# Patient Record
Sex: Female | Born: 1970 | Race: White | Hispanic: No | Smoking: Never smoker
Health system: Southern US, Community
[De-identification: ages and names within clinical notes are randomized; demographics above are authoritative.]

## PROBLEM LIST (undated history)

## (undated) DIAGNOSIS — I219 Acute myocardial infarction, unspecified: Secondary | ICD-10-CM

## (undated) DIAGNOSIS — E7800 Pure hypercholesterolemia, unspecified: Secondary | ICD-10-CM

## (undated) DIAGNOSIS — F419 Anxiety disorder, unspecified: Secondary | ICD-10-CM

## (undated) HISTORY — PX: LEEP: SHX91

## (undated) HISTORY — DX: Acute myocardial infarction, unspecified: I21.9

## (undated) HISTORY — DX: Pure hypercholesterolemia, unspecified: E78.00

## (undated) HISTORY — DX: Anxiety disorder, unspecified: F41.9

---

## 2008-07-22 ENCOUNTER — Ambulatory Visit (HOSPITAL_BASED_OUTPATIENT_CLINIC_OR_DEPARTMENT_OTHER): Admission: RE | Admit: 2008-07-22 | Discharge: 2008-07-22 | Payer: Self-pay | Admitting: Family Medicine

## 2008-07-22 ENCOUNTER — Ambulatory Visit: Payer: Self-pay | Admitting: Radiology

## 2008-07-23 ENCOUNTER — Ambulatory Visit: Payer: Self-pay | Admitting: Radiology

## 2008-07-23 ENCOUNTER — Ambulatory Visit (HOSPITAL_BASED_OUTPATIENT_CLINIC_OR_DEPARTMENT_OTHER): Admission: RE | Admit: 2008-07-23 | Discharge: 2008-07-23 | Payer: Self-pay | Admitting: Family Medicine

## 2008-08-07 ENCOUNTER — Ambulatory Visit: Payer: Self-pay | Admitting: Diagnostic Radiology

## 2008-08-07 ENCOUNTER — Ambulatory Visit (HOSPITAL_BASED_OUTPATIENT_CLINIC_OR_DEPARTMENT_OTHER): Admission: RE | Admit: 2008-08-07 | Discharge: 2008-08-07 | Payer: Self-pay | Admitting: Family Medicine

## 2011-01-19 IMAGING — CR DG KNEE COMPLETE 4+V*L*
5 series · 5 of 5 positions shown · non-contrast
Comparison: None available.

CLINICAL DATA: Left knee pain.  Status post fall 8 days ago.

LEFT KNEE - COMPLETE 4+ VIEW

[t knee ap left]
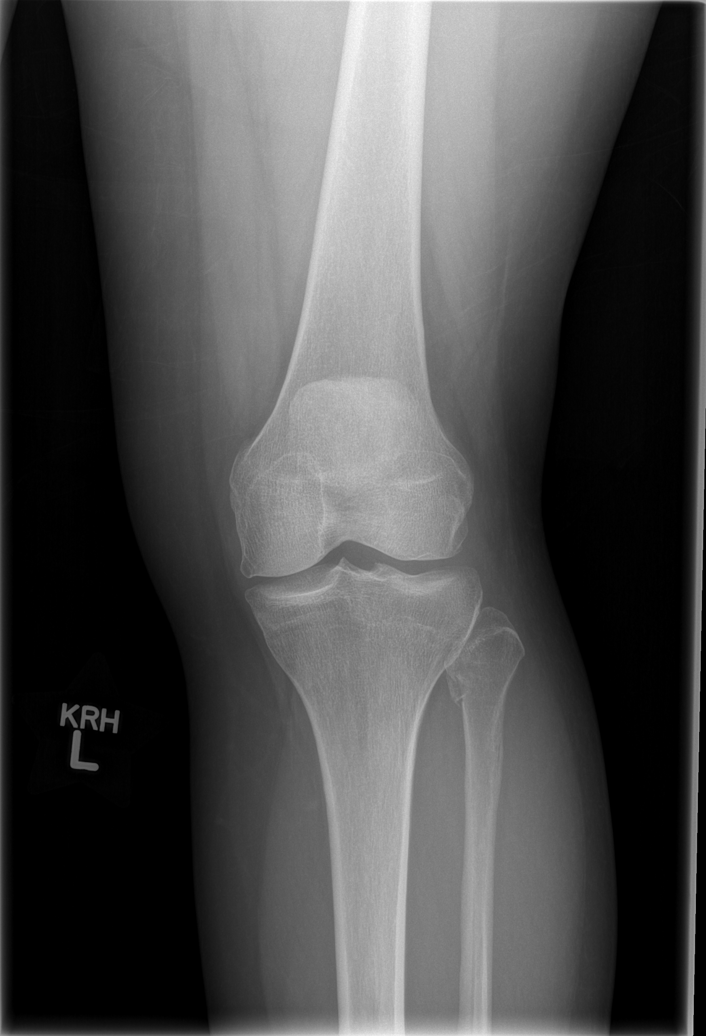

[t knee oblique left (1 of 2)]
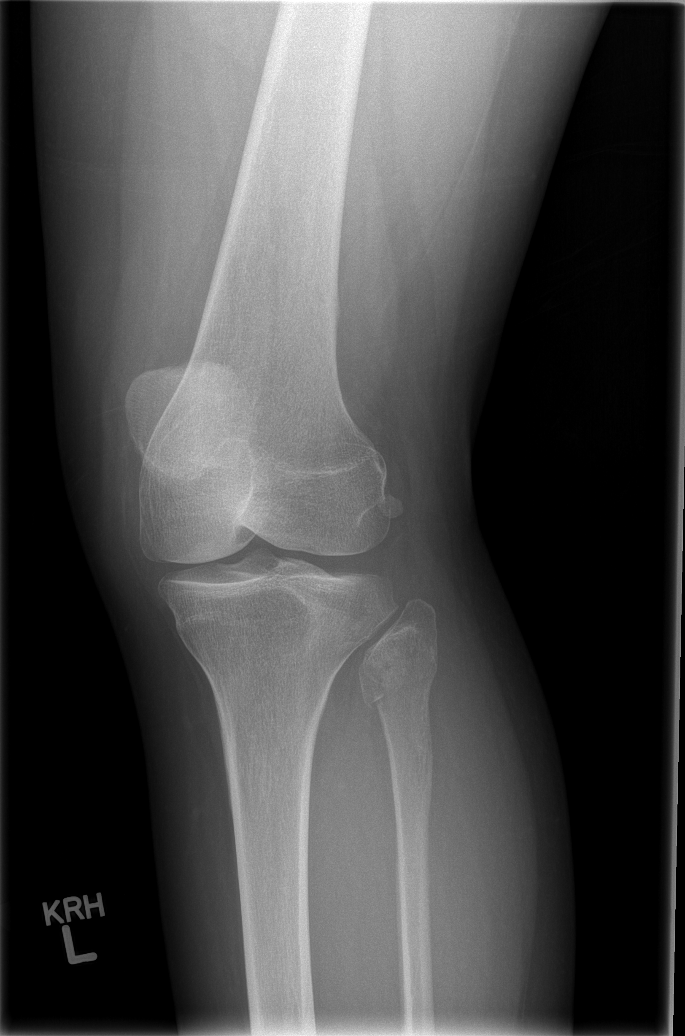

[t knee oblique left (2 of 2)]
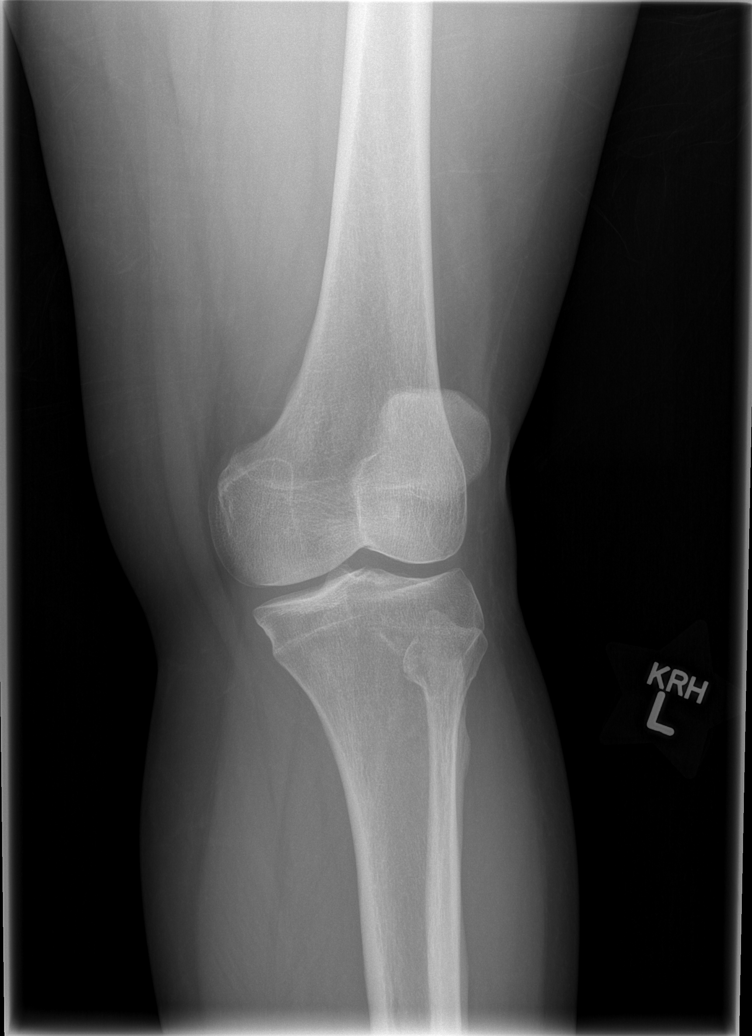

[t knee lat left (1 of 2)]
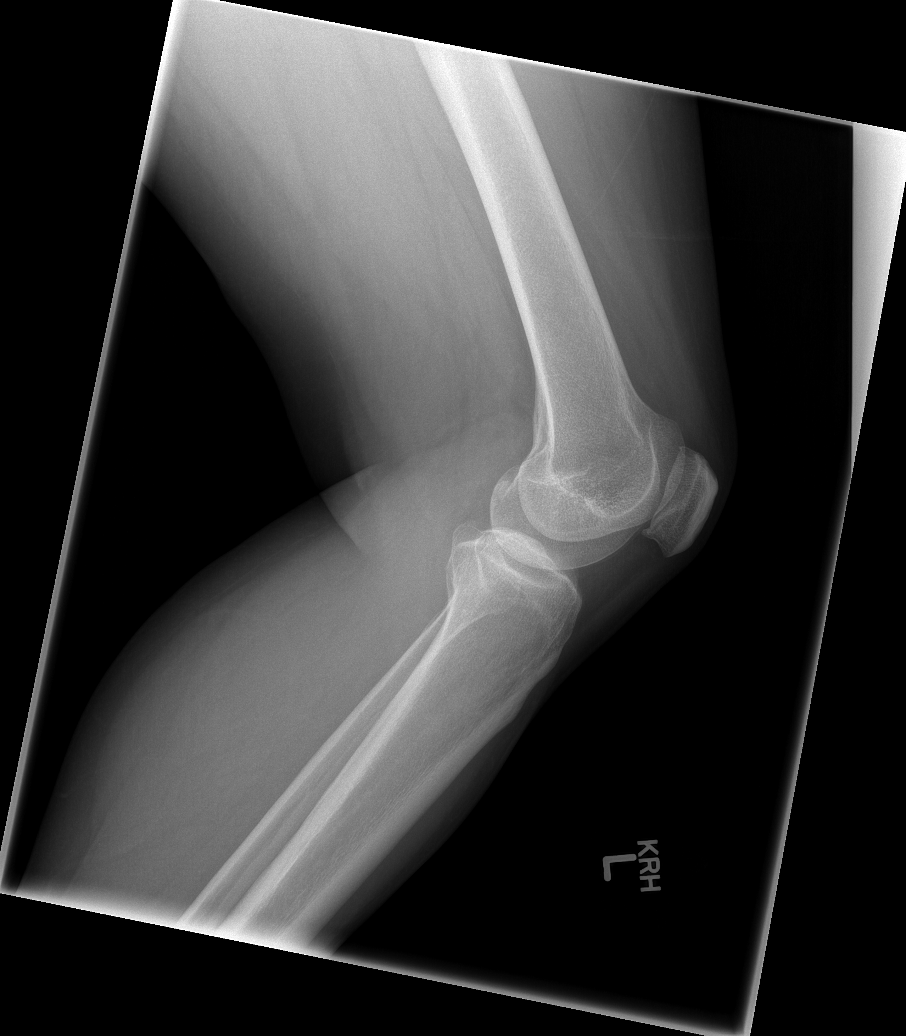

[t knee lat left (2 of 2)]
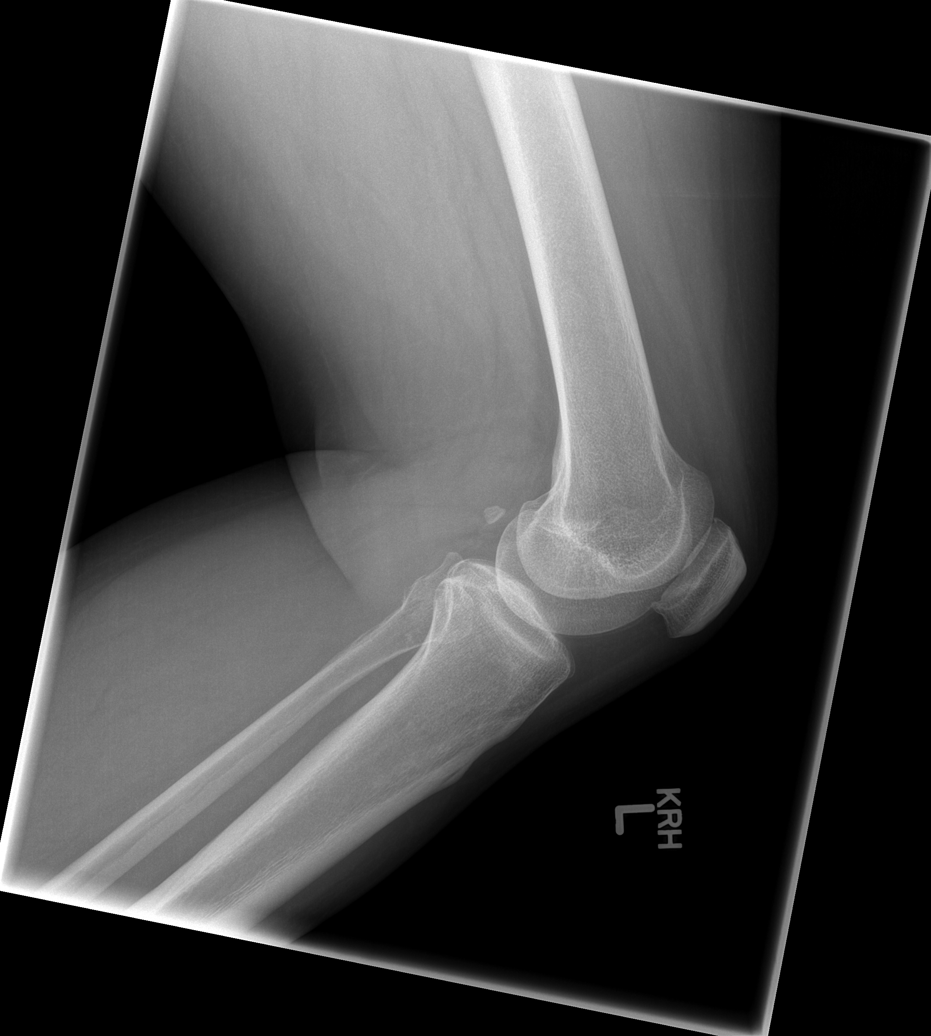

[5 of 5 positions shown; findings below may reference images not displayed]

FINDINGS: A minimally-displaced fracture is present at the left
fibular head.  Mild degenerative changes are noted in the medial
and patellofemoral compartments of the head.  There is no
significant joint effusion.
IMPRESSION: 1.  Minimally-displaced fracture of the fibular head.
2.  Mild degenerative changes of the knee.

## 2019-02-13 ENCOUNTER — Emergency Department (HOSPITAL_COMMUNITY): Payer: BC Managed Care – PPO

## 2019-02-13 ENCOUNTER — Emergency Department (HOSPITAL_COMMUNITY)
Admission: EM | Admit: 2019-02-13 | Discharge: 2019-02-13 | Disposition: A | Discharge status: Home / Self Care | Payer: BC Managed Care – PPO | Attending: Emergency Medicine | Admitting: Emergency Medicine

## 2019-02-13 ENCOUNTER — Other Ambulatory Visit: Payer: Self-pay

## 2019-02-13 DIAGNOSIS — H539 Unspecified visual disturbance: Secondary | ICD-10-CM | POA: Diagnosis not present

## 2019-02-13 LAB — CBC WITH DIFFERENTIAL/PLATELET
Abs Immature Granulocytes: 0.08 10*3/uL — ABNORMAL HIGH (ref 0.00–0.07)
Basophils Absolute: 0 10*3/uL (ref 0.0–0.1)
Basophils Relative: 0 %
Eosinophils Absolute: 0.2 10*3/uL (ref 0.0–0.5)
Eosinophils Relative: 1 %
HCT: 41.7 % (ref 36.0–46.0)
Hemoglobin: 13.7 g/dL (ref 12.0–15.0)
Immature Granulocytes: 1 %
Lymphocytes Relative: 18 %
Lymphs Abs: 2.2 10*3/uL (ref 0.7–4.0)
MCH: 29.8 pg (ref 26.0–34.0)
MCHC: 32.9 g/dL (ref 30.0–36.0)
MCV: 90.7 fL (ref 80.0–100.0)
Monocytes Absolute: 1 10*3/uL (ref 0.1–1.0)
Monocytes Relative: 8 %
Neutro Abs: 8.9 10*3/uL — ABNORMAL HIGH (ref 1.7–7.7)
Neutrophils Relative %: 72 %
Platelets: 268 10*3/uL (ref 150–400)
RBC: 4.6 MIL/uL (ref 3.87–5.11)
RDW: 12.6 % (ref 11.5–15.5)
WBC: 12.3 10*3/uL — ABNORMAL HIGH (ref 4.0–10.5)
nRBC: 0 % (ref 0.0–0.2)

## 2019-02-13 LAB — BASIC METABOLIC PANEL
Anion gap: 11 (ref 5–15)
BUN: 15 mg/dL (ref 6–20)
CO2: 24 mmol/L (ref 22–32)
Calcium: 9.5 mg/dL (ref 8.9–10.3)
Chloride: 104 mmol/L (ref 98–111)
Creatinine, Ser: 0.71 mg/dL (ref 0.44–1.00)
GFR calc Af Amer: >60 mL/min (ref >60)
GFR calc non Af Amer: >60 mL/min (ref >60)
Glucose, Bld: 106 mg/dL — ABNORMAL HIGH (ref 70–99)
Potassium: 3.6 mmol/L (ref 3.5–5.1)
Sodium: 139 mmol/L (ref 135–145)

## 2019-02-13 MED ORDER — GADOBUTROL 1 MMOL/ML IV SOLN
7.5000 mL | Freq: Once | INTRAVENOUS | Status: AC | PRN
  Administered 2019-02-13: 7.5 mL via INTRAVENOUS

## 2019-02-13 NOTE — ED Notes (Signed)
Pt returned to room from MRI.

## 2019-02-13 NOTE — Discharge Instructions (Addendum)
Please follow-up with Dr. Manuella Ghazi as previously indicated.  Please return to the emergency room immediately if he develop any new or worsening signs or symptoms.

## 2019-02-13 NOTE — ED Provider Notes (Signed)
[place] EMERGENCY DEPARTMENT Provider Note   CSN: [id number] Arrival date & time: 02/13/19  5366     History   Chief Complaint No chief complaint on file.   HPI Kathleen Richards is a 48 y.o. female.     HPI  48 year old female presents today for MRI.  Patient notes that she had a minor change in her visual acuity in her left eye that she was having difficulty seeing up close.  She was seen by the optometrist and told she had some inflammation in the eye.  She was referred to ophthalmology where she was evaluated and referred to the ED for MRI.  Patient was seen by Dr. Manuella Ghazi at [facility] with concern for orbital mass posterior to the globe.  She notes occasional headache, denies any significant eye pain or headache presently.  She denies any distal neurological deficits, change in smell or taste.   No past medical history on file.  There are no active problems to display for this patient.     OB History   No obstetric history on file.      Home Medications    Prior to Admission medications   Not on File    Family History No family history on file.  Social History Social History   Tobacco Use  . Smoking status: Not on file  Substance Use Topics  . Alcohol use: Not on file  . Drug use: Not on file     Allergies   Patient has no allergy information on record.   Review of Systems Review of Systems  All other systems reviewed and are negative.   Physical Exam Updated Vital Signs BP (!) 142/73 (BP Location: Left Arm)   Pulse 83   Temp 97.9 F (36.6 C) (Oral)   Resp 17   SpO2 100%   Physical Exam Vitals signs and nursing note reviewed.  Constitutional:      Appearance: She is well-developed.  HENT:     Head: Normocephalic and atraumatic.     Comments: Pupils equal round and reactive light and symmetric, extraocular movements intact and pain-free, vision intact bilateral Eyes:     General: No scleral icterus.       Right  eye: No discharge.        Left eye: No discharge.     Conjunctiva/sclera: Conjunctivae normal.     Pupils: Pupils are equal, round, and reactive to light.  Neck:     Musculoskeletal: Normal range of motion.     Vascular: No JVD.     Trachea: No tracheal deviation.  Pulmonary:     Effort: Pulmonary effort is normal.     Breath sounds: No stridor.  Neurological:     General: No focal deficit present.     Mental Status: She is alert and oriented to person, place, and time. Mental status is at baseline.     Cranial Nerves: No cranial nerve deficit.     Sensory: No sensory deficit.     Motor: No weakness.     Coordination: Coordination normal.  Psychiatric:        Behavior: Behavior normal.        Thought Content: Thought content normal.        Judgment: Judgment normal.      ED Treatments / Results  Labs (all labs ordered are listed, but only abnormal results are displayed) Labs Reviewed  CBC WITH DIFFERENTIAL/PLATELET - Abnormal; Notable for the following components:  Result Value   WBC 12.3 (*)    Neutro Abs 8.9 (*)    Abs Immature Granulocytes 0.08 (*)    All other components within normal limits  BASIC METABOLIC PANEL - Abnormal; Notable for the following components:   Glucose, Bld 106 (*)    All other components within normal limits    EKG None  Radiology No results found.  Procedures Procedures (including critical care time)  Medications Ordered in ED Medications  gadobutrol (GADAVIST) 1 MMOL/ML injection 7.5 mL (7.5 mLs Intravenous Contrast Given 02/13/19 1403)     Initial Impression / Assessment and Plan / ED Course  I have reviewed the triage vital signs and the nursing notes.  Pertinent labs & imaging results that were available during my care of the patient were reviewed by me and considered in my medical decision making (see chart for details).        Labs:   Imaging:  Consults: Dr. Sherryll BurgerShah  Therapeutics:  Discharge Meds:     Assessment/Plan: [age] YOF presents today for MRI via ophthalmology.  Patient has abnormality on her exam per ophthalmology requires MRI with and without contrast of the brain and orbits.  MRI returned showing no acute abnormalities.  I discussed the case with ophthalmology and neuroradiologist.  Patient is stable for outpatient follow-up with ophthalmology and will be following up this week.  She is given strict return precautions, she verbalized understanding and agreement to this plan had no further questions or concerns.   Final Clinical Impressions(s) / ED Diagnoses   Final diagnoses:  Vision changes    ED Discharge Orders    None       Rosalio LoudHedges, Narayan Scull, PA-C 02/17/19 0931    Blane OharaZavitz, Joshua, MD 02/20/19 [phone]

## 2019-02-13 NOTE — ED Notes (Signed)
Patient verbalizes understanding of discharge instructions. Opportunity for questioning and answers were provided. Armband removed by staff. Patient discharged from ED.  

## 2019-02-13 NOTE — ED Notes (Signed)
Patient transported to MRI 

## 2019-02-13 NOTE — ED Triage Notes (Addendum)
Patient sent by ophthalmologist for MRI of L eye - patient endorses vision changes of L eye, headache, and 55mm protrusion (per doctor) since June. NAD noted.

## 2019-08-28 NOTE — Progress Notes (Signed)
Office Visit Note  Patient: Kathleen Richards             Date of Birth: December 26, 1970           MRN: [id number]             PCP: Mariel Sleet Referring: Elisabeth Cara, * Visit Date: 09/04/2019 Occupation: '@GUAROCC' @  Subjective:  Scleritis.   History of Present Illness: Kathleen Richards is a 49 y.o. female seen in consultation per request of her PCP.  According to patient her symptoms started about 3 years ago with left ankle joint swelling.  She states she started experiencing severe pain in her left ankle joint which was last about 3 to 4 days and these episodes will happen about every 3 to 4 months.  She was seen by Dr. Nehemiah Massed in [facility] who did work-up and diagnosed her with pseudogout as all of the work-up was negative.  She was placed on colchicine which she took on a as needed basis for about 1 week.  She states she has not had any episodes in the last 2 years.  In March 2020 she was hospitalized due to MI which was related to supraventricular tachycardia per patient.  She was a started on multiple medications and she has been symptom-free.  She has been followed by cardiologist.  She states in May 2020 she started experiencing left eye discomfort for which she was seen by her optometrist and prescription change was noted.  She was referred to Dr. Manuella Ghazi for local ophthalmologist who did MRI of her brain which was normal and referred her to Dr. Dwana Melena at [facility].  He diagnosed her with posterior scleritis.  No treatment was offered.  She was referred to a neurologist because her mother has history of multiple sclerosis.  Her neurological work-up including MRI of her brain was normal.  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  No treatment was offered.  She was also referred to a hematologist.  Besides elevated WBCs count no other diagnosis was established.  She was referred to  me for further evaluation of any underlying autoimmune disease.  She feels some stiffness in her legs in the morning.  But had no joint pain or joint swelling.  Activities of Daily Living:  Patient reports morning stiffness for 5-10 minutes.   Patient Denies nocturnal pain.  Difficulty dressing/grooming: Denies Difficulty climbing stairs: Denies Difficulty getting out of chair: Denies Difficulty using hands for taps, buttons, cutlery, and/or writing: Reports  Review of Systems  Constitutional: Positive for fatigue. Negative for night sweats, weight gain and weight loss.  HENT: Positive for sore tongue. Negative for mouth sores, trouble swallowing, trouble swallowing, mouth dryness and nose dryness.   Eyes: Negative for pain, redness, itching, visual disturbance and dryness.  Respiratory: Negative for cough, shortness of breath and difficulty breathing.   Cardiovascular: Negative for chest pain, palpitations, hypertension, irregular heartbeat and swelling in legs/feet.  Gastrointestinal: Negative for blood in stool, constipation and diarrhea.  Endocrine: Negative for increased urination.  Genitourinary: Negative for difficulty urinating, painful urination and vaginal dryness.  Musculoskeletal: Positive for arthralgias, joint pain and morning stiffness. Negative for joint swelling, myalgias, muscle weakness, muscle tenderness and myalgias.  Skin: Positive for rash. Negative for color change, hair loss, skin tightness, ulcers and sensitivity to sunlight.       Diagnosed  with rosacea by dermatologist in the past.  Allergic/Immunologic: Negative for susceptible to infections.  Neurological: Positive for headaches. Negative for dizziness, numbness, memory loss, night sweats and weakness.  Hematological: Negative for bruising/bleeding tendency and swollen glands.  Psychiatric/Behavioral: Positive for sleep disturbance. Negative for depressed mood and confusion. The patient is nervous/anxious.      PMFS History:  Patient Active Problem List   Diagnosis Date Noted  . 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 History of hyperlipidemia 09/04/2019  . GAD (generalized anxiety disorder) 09/04/2019  . Seasonal allergies 09/04/2019  . SVT (supraventricular tachycardia) ([place]) 09/04/2019  . History of MI (myocardial infarction) 09/04/2019    Past Medical History:  Diagnosis Date  . Anxiety   . Heart attack ([place])   . High cholesterol     Family History  Problem Relation Age of Onset  . Pancreatic cancer Mother   . Multiple sclerosis Mother   . Skin cancer Father   . COPD Father   . Arrhythmia Sister   . Anxiety disorder Sister    Past Surgical History:  Procedure Laterality Date  . LEEP     x2 (age 31 and 7)   Social History   Social History Narrative  . Not on file    There is no immunization history on file for this patient.   Objective: Vital Signs: BP (!) 143/84 (BP Location: Right Arm, Patient Position: Sitting, Cuff Size: Normal)   Pulse 77   Resp 14   Ht '5\' 3"'  (1.6 m)   Wt 220 lb (99.8 kg)   BMI 38.97 kg/m    Physical Exam Vitals and nursing note reviewed.  Constitutional:      Appearance: She is well-developed.  HENT:     Head: Normocephalic and atraumatic.  Eyes:     Conjunctiva/sclera: Conjunctivae normal.  Cardiovascular:     Rate and Rhythm: Normal rate and regular rhythm.     Heart sounds: Normal heart sounds.  Pulmonary:     Effort: Pulmonary effort is normal.     Breath sounds: Normal breath sounds.  Abdominal:     General: Bowel sounds are normal.     Palpations: Abdomen is soft.  Musculoskeletal:     Cervical back: Normal range of motion.  Lymphadenopathy:     Cervical: No cervical adenopathy.  Skin:    General: Skin is warm and dry.     Capillary Refill: Capillary refill takes less than 2 seconds.  Neurological:     Mental Status: She is alert and oriented to person, place, and  time.  Psychiatric:        Behavior: Behavior normal.      Musculoskeletal Exam: C-spine, thoracic and lumbar spine were in good range of motion.  She had no SI joint tenderness.  Shoulder joints, elbow joints, wrist joints, MCPs PIPs been good range of motion.  She has mild DIP thickening.  Hip joints, knee joints, ankles with good range of motion.  She has bilateral pes planus and ankle joint subluxation.  No synovitis was noted. CDAI Exam: CDAI Score: -- Patient Global: --; Provider Global: -- Swollen: --; Tender: -- Joint Exam 09/04/2019   No joint exam has been documented for this visit   There is currently no information documented on the homunculus. Go to the Rheumatology activity and complete the homunculus joint exam.  Investigation: No additional findings.  Imaging: No results found.  Recent Labs: Lab Results  Component  Value Date   WBC 12.3 (H) 02/13/2019   HGB 13.7 02/13/2019   PLT 268 02/13/2019   NA 139 02/13/2019   K 3.6 02/13/2019   CL 104 02/13/2019   CO2 24 02/13/2019   GLUCOSE 106 (H) 02/13/2019   BUN 15 02/13/2019   CREATININE 0.71 02/13/2019   CALCIUM 9.5 02/13/2019   GFRAA >60 02/13/2019  Labs from [facility] May 15, 2019 TB Gold negative, pan ANCA negative, RF negative, ACE normal, ESR 31 February 21, 2019 CBC showed WBC count 12.3  Speciality Comments: No specialty comments available.  Procedures:  No procedures performed Allergies: Patient has no known allergies.   Assessment / Plan:     Visit Diagnoses: Posterior scleritis of left eye - Dr. Leodis Liverpool and Dr. Manuella Ghazi.  Brain MRI 02/13/19 WNL.  Fluorescein angiogram planned.  Patient had extensive work-up by ophthalmologist in [place], [facility], [place].  She is also seen neurologist and hematologist.  I will complete autoimmune work-up which has not been done so far.  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  No treatment was advised.  Pes planus of both feet-she has severe  bilateral pes planus and ankle subluxation.  No synovitis was noted.  Positive ANA-I will obtain additional labs today.  History of hyperlipidemia  History of MI (myocardial infarction) - March 2020 secondary to SVT.  SVT (supraventricular tachycardia) ([facility])  GAD (generalized anxiety disorder)  Seasonal allergies  Family history of MS (multiple sclerosis) - Mother  Pseudogout-patient states about 3 years ago she was having recurrent swelling of her left ankle joint which was diagnosed as pseudogout by Dr. Nehemiah Massed.  She was placed on colchicine.  Although she has not had any episodes in 2 years and did not require the medication.  Orders: Orders Placed This Encounter  Procedures  . CBC with Differential/Platelet  . COMPLETE METABOLIC PANEL WITH GFR  . HLA-B27 antigen  . ANA  . Cyclic citrul peptide antibody, IgG  . 14-3-3 eta Protein  . Anti-scleroderma antibody  . RNP Antibody  . Anti-Smith antibody  . Sjogrens syndrome-A extractable nuclear antibody  . Sjogrens syndrome-B extractable nuclear antibody  . Anti-DNA antibody, double-stranded  . C3 and C4   No orders of the defined types were placed in this encounter.   Face-to-face time spent with patient was 60 minutes. Greater than 50% of time was spent in counseling and coordination of care.  Follow-Up Instructions: No follow-ups on file.   Bo Merino, MD  Note - This record has been created using Editor, commissioning.  Chart creation errors have been sought, but may not always  have been located. Such creation errors do not reflect on  the standard of medical care.

## 2019-09-04 ENCOUNTER — Ambulatory Visit: Payer: BC Managed Care – PPO | Admitting: Rheumatology

## 2019-09-04 ENCOUNTER — Encounter: Payer: Self-pay | Admitting: Rheumatology

## 2019-09-04 ENCOUNTER — Other Ambulatory Visit: Payer: Self-pay

## 2019-09-04 VITALS — BP 143/84 | HR 77 | Resp 14 | Ht 63.0 in | Wt 220.0 lb

## 2019-09-04 DIAGNOSIS — H15032 Posterior scleritis, left eye: Secondary | ICD-10-CM | POA: Diagnosis not present

## 2019-09-04 DIAGNOSIS — H318 Other specified disorders of choroid: Secondary | ICD-10-CM | POA: Diagnosis not present

## 2019-09-04 DIAGNOSIS — Z820 Family history of epilepsy and other diseases of the nervous system: Secondary | ICD-10-CM

## 2019-09-04 DIAGNOSIS — R768 Other specified abnormal immunological findings in serum: Secondary | ICD-10-CM

## 2019-09-04 DIAGNOSIS — Z8639 Personal history of other endocrine, nutritional and metabolic disease: Secondary | ICD-10-CM

## 2019-09-04 DIAGNOSIS — M2141 Flat foot [pes planus] (acquired), right foot: Secondary | ICD-10-CM | POA: Diagnosis not present

## 2019-09-04 DIAGNOSIS — J302 Other seasonal allergic rhinitis: Secondary | ICD-10-CM | POA: Insufficient documentation

## 2019-09-04 DIAGNOSIS — R7689 Other specified abnormal immunological findings in serum: Secondary | ICD-10-CM

## 2019-09-04 DIAGNOSIS — M1120 Other chondrocalcinosis, unspecified site: Secondary | ICD-10-CM

## 2019-09-04 DIAGNOSIS — F411 Generalized anxiety disorder: Secondary | ICD-10-CM

## 2019-09-04 DIAGNOSIS — I471 Supraventricular tachycardia, unspecified: Secondary | ICD-10-CM | POA: Insufficient documentation

## 2019-09-04 DIAGNOSIS — I252 Old myocardial infarction: Secondary | ICD-10-CM | POA: Insufficient documentation

## 2019-09-04 DIAGNOSIS — M2142 Flat foot [pes planus] (acquired), left foot: Secondary | ICD-10-CM

## 2019-09-12 LAB — 14-3-3 ETA PROTEIN: 14-3-3 eta Protein: <0.2 ng/mL (ref <0.2)

## 2019-09-12 LAB — CBC WITH DIFFERENTIAL/PLATELET
Absolute Monocytes: 893 cells/uL (ref 200–950)
Basophils Absolute: 45 cells/uL (ref 0–200)
Basophils Relative: 0.4 %
Eosinophils Absolute: 181 cells/uL (ref 15–500)
Eosinophils Relative: 1.6 %
HCT: 40.5 % (ref 35.0–45.0)
Hemoglobin: 13.6 g/dL (ref 11.7–15.5)
Lymphs Abs: 2249 cells/uL (ref 850–3900)
MCH: 30.3 pg (ref 27.0–33.0)
MCHC: 33.6 g/dL (ref 32.0–36.0)
MCV: 90.2 fL (ref 80.0–100.0)
MPV: 9.8 fL (ref 7.5–12.5)
Monocytes Relative: 7.9 %
Neutro Abs: 7933 cells/uL — ABNORMAL HIGH (ref 1500–7800)
Neutrophils Relative %: 70.2 %
Platelets: 305 10*3/uL (ref 140–400)
RBC: 4.49 10*6/uL (ref 3.80–5.10)
RDW: 12.5 % (ref 11.0–15.0)
Total Lymphocyte: 19.9 %
WBC: 11.3 10*3/uL — ABNORMAL HIGH (ref 3.8–10.8)

## 2019-09-12 LAB — ANTI-DNA ANTIBODY, DOUBLE-STRANDED: ds DNA Ab: <1 IU/mL

## 2019-09-12 LAB — COMPLETE METABOLIC PANEL WITH GFR
AG Ratio: 1.5 (calc) (ref 1.0–2.5)
ALT: 16 U/L (ref 6–29)
AST: 15 U/L (ref 10–35)
Albumin: 4.2 g/dL (ref 3.6–5.1)
Alkaline phosphatase (APISO): 70 U/L (ref 31–125)
BUN: 15 mg/dL (ref 7–25)
CO2: 28 mmol/L (ref 20–32)
Calcium: 9.9 mg/dL (ref 8.6–10.2)
Chloride: 103 mmol/L (ref 98–110)
Creat: 0.67 mg/dL (ref 0.50–1.10)
GFR, Est African American: 120 mL/min/{1.73_m2} (ref >=60)
GFR, Est Non African American: 104 mL/min/{1.73_m2} (ref >=60)
Globulin: 2.8 g/dL (calc) (ref 1.9–3.7)
Glucose, Bld: 88 mg/dL (ref 65–99)
Potassium: 4.2 mmol/L (ref 3.5–5.3)
Sodium: 138 mmol/L (ref 135–146)
Total Bilirubin: 0.4 mg/dL (ref 0.2–1.2)
Total Protein: 7 g/dL (ref 6.1–8.1)

## 2019-09-12 LAB — RNP ANTIBODY: Ribonucleic Protein(ENA) Antibody, IgG: <1 AI

## 2019-09-12 LAB — ANTI-SMITH ANTIBODY: ENA SM Ab Ser-aCnc: <1 AI

## 2019-09-12 LAB — ANTI-SCLERODERMA ANTIBODY: Scleroderma (Scl-70) (ENA) Antibody, IgG: <1 AI

## 2019-09-12 LAB — SJOGRENS SYNDROME-B EXTRACTABLE NUCLEAR ANTIBODY: SSB (La) (ENA) Antibody, IgG: <1 AI

## 2019-09-12 LAB — C3 AND C4
C3 Complement: 173 mg/dL (ref 83–193)
C4 Complement: >58 mg/dL — ABNORMAL HIGH (ref 15–57)

## 2019-09-12 LAB — ANA: Anti Nuclear Antibody (ANA): NEGATIVE

## 2019-09-12 LAB — CYCLIC CITRUL PEPTIDE ANTIBODY, IGG: Cyclic Citrullin Peptide Ab: <16 UNITS

## 2019-09-12 LAB — SJOGRENS SYNDROME-A EXTRACTABLE NUCLEAR ANTIBODY: SSA (Ro) (ENA) Antibody, IgG: <1 AI

## 2019-09-12 LAB — HLA-B27 ANTIGEN: HLA-B27 Antigen: NEGATIVE

## 2019-09-19 NOTE — Progress Notes (Deleted)
Office Visit Note  Patient: Kathleen Richards             Date of Birth: 1970/12/23           MRN: [id number]             PCP: Mariel Sleet Referring: Elisabeth Cara, * Visit Date: 09/24/2019 Occupation: _0 @  Subjective:  No chief complaint on file.   History of Present Illness: Kathleen Richards is a 49 y.o. female ***   Activities of Daily Living:  Patient reports morning stiffness for *** {minute/hour:19697}.   Patient {ACTIONS;DENIES/REPORTS:21021675::"Denies"} nocturnal pain.  Difficulty dressing/grooming: {ACTIONS;DENIES/REPORTS:21021675::"Denies"} Difficulty climbing stairs: {ACTIONS;DENIES/REPORTS:21021675::"Denies"} Difficulty getting out of chair: {ACTIONS;DENIES/REPORTS:21021675::"Denies"} Difficulty using hands for taps, buttons, cutlery, and/or writing: {ACTIONS;DENIES/REPORTS:21021675::"Denies"}  No Rheumatology ROS completed.   PMFS History:  Patient Active Problem List   Diagnosis Date Noted  . 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 History of hyperlipidemia 09/04/2019  . GAD (generalized anxiety disorder) 09/04/2019  . Seasonal allergies 09/04/2019  . SVT (supraventricular tachycardia) ([place]) 09/04/2019  . History of MI (myocardial infarction) 09/04/2019    Past Medical History:  Diagnosis Date  . Anxiety   . Heart attack ([place])   . High cholesterol     Family History  Problem Relation Age of Onset  . Pancreatic cancer Mother   . Multiple sclerosis Mother   . Skin cancer Father   . COPD Father   . Arrhythmia Sister   . Anxiety disorder Sister    Past Surgical History:  Procedure Laterality Date  . LEEP     x2 (age 70 and 75)   Social History   Social History Narrative  . Not on file    There is no immunization history on file for this patient.   Objective: Vital Signs: There were no vitals taken for this visit.   Physical Exam   Musculoskeletal Exam: ***  CDAI  Exam: CDAI Score: -- Patient Global: --; Provider Global: -- Swollen: --; Tender: -- Joint Exam 09/24/2019   No joint exam has been documented for this visit   There is currently no information documented on the homunculus. Go to the Rheumatology activity and complete the homunculus joint exam.  Investigation: No additional findings.  Imaging: No results found.  Recent Labs: Lab Results  Component Value Date   WBC 11.3 (H) 09/04/2019   HGB 13.6 09/04/2019   PLT 305 09/04/2019   NA 138 09/04/2019   K 4.2 09/04/2019   CL 103 09/04/2019   CO2 28 09/04/2019   GLUCOSE 88 09/04/2019   BUN 15 09/04/2019   CREATININE 0.67 09/04/2019   BILITOT 0.4 09/04/2019   AST 15 09/04/2019   ALT 16 09/04/2019   PROT 7.0 09/04/2019   CALCIUM 9.9 09/04/2019   GFRAA 120 09/04/2019  September 04, 2019 ANA negative, C3-C4 normal, HLA-B27 negative, anti-CCP negative, _1 eta negative  Speciality Comments: No specialty comments available.  Procedures:  No procedures performed Allergies: Patient has no known allergies.   Assessment / Plan:     Visit Diagnoses: No diagnosis found.  Orders: No orders of the defined types were placed in this encounter.  No orders of the defined types were placed in this encounter.   Face-to-face time spent with patient was *** minutes. Greater than 50% of time was spent in counseling and coordination of care.  Follow-Up Instructions: No follow-ups on file.   Bo Merino, MD  Note - This record has been created using [employer].  Chart creation errors have been sought, but may not always  have been located. Such creation errors do not reflect on  the standard of medical care.

## 2019-09-24 ENCOUNTER — Ambulatory Visit: Payer: BC Managed Care – PPO | Admitting: Rheumatology

## 2024-06-09 ENCOUNTER — Emergency Department (HOSPITAL_COMMUNITY)
Admission: EM | Admit: 2024-06-09 | Discharge: 2024-06-09 | Disposition: A | Discharge status: Home / Self Care | Source: Ambulatory Visit | Attending: Emergency Medicine | Admitting: Emergency Medicine

## 2024-06-09 ENCOUNTER — Other Ambulatory Visit: Payer: Self-pay

## 2024-06-09 ENCOUNTER — Encounter (HOSPITAL_COMMUNITY): Payer: Self-pay

## 2024-06-09 ENCOUNTER — Emergency Department (HOSPITAL_COMMUNITY)

## 2024-06-09 DIAGNOSIS — N132 Hydronephrosis with renal and ureteral calculous obstruction: Secondary | ICD-10-CM | POA: Diagnosis not present

## 2024-06-09 DIAGNOSIS — R1011 Right upper quadrant pain: Secondary | ICD-10-CM | POA: Diagnosis present

## 2024-06-09 DIAGNOSIS — N200 Calculus of kidney: Secondary | ICD-10-CM

## 2024-06-09 DIAGNOSIS — Z7982 Long term (current) use of aspirin: Secondary | ICD-10-CM | POA: Diagnosis not present

## 2024-06-09 LAB — URINALYSIS, ROUTINE W REFLEX MICROSCOPIC
Bilirubin Urine: NEGATIVE
Glucose, UA: NEGATIVE mg/dL
Ketones, ur: NEGATIVE mg/dL
Nitrite: NEGATIVE
Protein, ur: 100 mg/dL — AB
RBC / HPF: >50 RBC/hpf (ref 0–5)
Specific Gravity, Urine: 1.033 — ABNORMAL HIGH (ref 1.005–1.030)
WBC, UA: >50 WBC/hpf (ref 0–5)
pH: 5 (ref 5.0–8.0)

## 2024-06-09 LAB — CBC
HCT: 38.5 % (ref 36.0–46.0)
Hemoglobin: 13.6 g/dL (ref 12.0–15.0)
MCH: 31.9 pg (ref 26.0–34.0)
MCHC: 35.3 g/dL (ref 30.0–36.0)
MCV: 90.4 fL (ref 80.0–100.0)
Platelets: 254 K/uL (ref 150–400)
RBC: 4.26 MIL/uL (ref 3.87–5.11)
RDW: 11.7 % (ref 11.5–15.5)
WBC: 9.9 K/uL (ref 4.0–10.5)
nRBC: 0 % (ref 0.0–0.2)

## 2024-06-09 LAB — COMPREHENSIVE METABOLIC PANEL WITH GFR
ALT: 40 U/L (ref 0–44)
AST: 34 U/L (ref 15–41)
Albumin: 4.7 g/dL (ref 3.5–5.0)
Alkaline Phosphatase: 64 U/L (ref 38–126)
Anion gap: 13 (ref 5–15)
BUN: 28 mg/dL — ABNORMAL HIGH (ref 6–20)
CO2: 21 mmol/L — ABNORMAL LOW (ref 22–32)
Calcium: 9.9 mg/dL (ref 8.9–10.3)
Chloride: 106 mmol/L (ref 98–111)
Creatinine, Ser: 0.95 mg/dL (ref 0.44–1.00)
GFR, Estimated: >60 mL/min (ref >60)
Glucose, Bld: 83 mg/dL (ref 70–99)
Potassium: 3.4 mmol/L — ABNORMAL LOW (ref 3.5–5.1)
Sodium: 140 mmol/L (ref 135–145)
Total Bilirubin: 0.5 mg/dL (ref 0.0–1.2)
Total Protein: 8 g/dL (ref 6.5–8.1)

## 2024-06-09 LAB — PREGNANCY, URINE: Preg Test, Ur: NEGATIVE

## 2024-06-09 LAB — LIPASE, BLOOD: Lipase: 48 U/L (ref 11–51)

## 2024-06-09 MED ORDER — KETOROLAC TROMETHAMINE 10 MG PO TABS: 10.0000 mg | ORAL_TABLET | Freq: Four times a day (QID) | ORAL | 0 refills | Status: AC | PRN

## 2024-06-09 MED ORDER — TAMSULOSIN HCL 0.4 MG PO CAPS: 0.4000 mg | ORAL_CAPSULE | Freq: Every day | ORAL | 0 refills | Status: AC

## 2024-06-09 MED ORDER — ONDANSETRON 4 MG PO TBDP: ORAL_TABLET | ORAL | 0 refills | Status: AC

## 2024-06-09 MED ORDER — HYDROCODONE-ACETAMINOPHEN 5-325 MG PO TABS: 1.0000 | ORAL_TABLET | ORAL | 0 refills | Status: AC | PRN

## 2024-06-09 MED ORDER — FENTANYL CITRATE (PF) 50 MCG/ML IJ SOSY
50.0000 ug | PREFILLED_SYRINGE | Freq: Once | INTRAMUSCULAR | Status: AC
  Administered 2024-06-09: 19:00:00 50 ug via INTRAVENOUS
  Filled 2024-06-09: qty 1

## 2024-06-09 NOTE — Discharge Instructions (Addendum)
 Call tomorrow to make an appointment to have close follow-up with a urologist.  Return to the emergency room if you have any worsening pain, fevers, ongoing vomiting, difficulty urinating or other worsening symptoms.

## 2024-06-09 NOTE — ED Triage Notes (Signed)
 LUQ pain that started Friday. Seen at [facility] and told to receive US  of gallbladder when Monday came. Nausea, no vomiting, last episode of diarrhea Friday.

## 2024-06-09 NOTE — ED Provider Notes (Signed)
 [place] [facility]GENC[facility] DEPARTMENT AT [facility] Provider Note   CSN: [id number] Arrival date & time: 06/09/24  1310     Patient presents with: Abdominal Pain   Kathleen Richards is a 53 y.o. female.   Patient is a 53 year old who presents with abdominal pain.  She says she has had some pain in her right upper abdomen for a couple months intermittently but starting 3 days ago it got more intense.  It radiates down toward her lower abdomen as well as around to her back.  She has had some nausea but no vomiting.  Seems to be a little worse after eating.  No urinary symptoms other than she typically has urinary frequency which is not really different than her baseline.  She will have some loose stools when the pain seems to be bad.  No history of prior abdominal surgeries.       Prior to Admission medications  Medication Sig Start Date End Date Taking? Authorizing Provider  HYDROcodone -acetaminophen  (NORCO/VICODIN) 5-325 MG tablet Take 1-2 tablets by mouth every 4 (four) hours as needed. 06/09/24  Yes Lenor Hollering, MD  ketorolac  (TORADOL ) 10 MG tablet Take 1 tablet (10 mg total) by mouth every 6 (six) hours as needed. 06/09/24  Yes Lenor Hollering, MD  ondansetron  (ZOFRAN -ODT) 4 MG disintegrating tablet 4mg  ODT q4 hours prn nausea/vomit 06/09/24  Yes Lenor Hollering, MD  tamsulosin  (FLOMAX ) 0.4 MG CAPS capsule Take 1 capsule (0.4 mg total) by mouth daily. 06/09/24  Yes Lenor Hollering, MD  albuterol (VENTOLIN HFA) 108 (90 Base) MCG/ACT inhaler INHALE 2 PUFFS INTO THE LUNGS 4 (FOUR) TIMES DAILY AS NEEDED FOR UP TO 30 DAYS FOR WHEEZING (COUGH). 08/06/18   [provider]  aspirin 81 MG EC tablet TAKE 1 TABLET BY MOUTH EVERY DAY 04/07/19   [provider]  budesonide-formoterol (SYMBICORT) 160-4.5 MCG/ACT inhaler Inhale into the lungs as needed. 11/13/16   [provider]  busPIRone (BUSPAR) 5 MG tablet Take 5 mg by mouth daily. 05/15/19   [provider]   cetirizine (ZYRTEC) 10 MG tablet Take by mouth daily. 05/06/15   [provider]  colchicine 0.6 MG tablet Take by mouth as needed. 10/17/16   [provider]  Cyanocobalamin ([employer] B12 GUMMIES) 500 MCG CHEW daily.    [provider]  diltiazem (CARDIZEM CD) 240 MG 24 hr capsule Take 240 mg by mouth daily. 08/10/19   [provider]  levonorgestrel (MIRENA) 20 MCG/24HR IUD by Intrauterine route.    [provider]  Melatonin 10 MG CAPS Take by mouth at bedtime.    [provider]  Multiple Vitamins tablet Take by mouth daily.    [provider]  rosuvastatin (CRESTOR) 10 MG tablet Take 10 mg by mouth daily. 08/18/19   [provider]  sertraline (ZOLOFT) 50 MG tablet Take 50 mg by mouth daily. 08/18/19   [provider]    Allergies: Patient has no known allergies.    Review of Systems  Constitutional:  Negative for chills, diaphoresis, fatigue and fever.  HENT:  Negative for congestion, rhinorrhea and sneezing.   Eyes: Negative.   Respiratory:  Negative for cough, chest tightness and shortness of breath.   Cardiovascular:  Negative for chest pain and leg swelling.  Gastrointestinal:  Positive for abdominal pain, diarrhea and nausea. Negative for blood in stool and vomiting.  Genitourinary:  Positive for frequency. Negative for difficulty urinating, flank pain and hematuria.  Musculoskeletal:  Negative for arthralgias and  back pain.  Skin:  Negative for rash.  Neurological:  Negative for dizziness, speech difficulty, weakness, numbness and headaches.    Updated Vital Signs BP 133/72   Pulse 96   Temp 98.2 F (36.8 C) (Oral)   Resp 16   Ht 5' 3 (1.6 m)   Wt 61.7 kg   SpO2 100%   BMI 24.09 kg/m   Physical Exam Constitutional:      Appearance: She is well-developed.  HENT:     Head: Normocephalic and atraumatic.  Eyes:     Pupils: Pupils are equal, round, and reactive to light.  Cardiovascular:      Rate and Rhythm: Normal rate and regular rhythm.     Heart sounds: Normal heart sounds.  Pulmonary:     Effort: Pulmonary effort is normal. No respiratory distress.     Breath sounds: Normal breath sounds. No wheezing or rales.  Chest:     Chest wall: No tenderness.  Abdominal:     General: Bowel sounds are normal.     Palpations: Abdomen is soft.     Tenderness: There is abdominal tenderness in the right upper quadrant and right lower quadrant. There is no guarding or rebound.  Musculoskeletal:        General: Normal range of motion.     Cervical back: Normal range of motion and neck supple.  Lymphadenopathy:     Cervical: No cervical adenopathy.  Skin:    General: Skin is warm and dry.     Findings: No rash.  Neurological:     Mental Status: She is alert and oriented to person, place, and time.     (all labs ordered are listed, but only abnormal results are displayed) Labs Reviewed  COMPREHENSIVE METABOLIC PANEL WITH GFR - Abnormal; Notable for the following components:      Result Value   Potassium 3.4 (*)    CO2 21 (*)    BUN 28 (*)    All other components within normal limits  URINALYSIS, ROUTINE W REFLEX MICROSCOPIC - Abnormal; Notable for the following components:   Color, Urine AMBER (*)    APPearance HAZY (*)    Specific Gravity, Urine 1.033 (*)    Hgb urine dipstick MODERATE (*)    Protein, ur 100 (*)    Leukocytes,Ua MODERATE (*)    Bacteria, UA FEW (*)    All other components within normal limits  LIPASE, BLOOD  CBC  PREGNANCY, URINE    EKG: None  Radiology: CT Renal Stone Study Result Date: 06/09/2024 EXAM: CT ABDOMEN AND PELVIS WITHOUT CONTRAST 06/09/2024 07:20:38 PM TECHNIQUE: CT of the abdomen and pelvis was performed without the administration of intravenous contrast. Multiplanar reformatted images are provided for review. Automated exposure control, iterative reconstruction, and/or weight-based adjustment of the mA/kV was utilized to reduce the  radiation dose to as low as reasonably achievable. COMPARISON: Abdominal ultrasound same day. CT abdomen and pelvis 09/14/2018, report only. CLINICAL HISTORY: Abdominal/flank pain, stone suspected. FINDINGS: LOWER CHEST: No acute abnormality. LIVER: The liver is unremarkable. GALLBLADDER AND BILE DUCTS: Gallbladder is unremarkable. No biliary ductal dilatation. SPLEEN: No acute abnormality. PANCREAS: No acute abnormality. ADRENAL GLANDS: No acute abnormality. KIDNEYS, URETERS AND BLADDER: There is a 4 mm calculus in the proximal right ureter. There is mild right sided hydronephrosis. There are additional punctate nonobstructing left renal calculi. No perinephric or periureteral stranding. Urinary bladder is unremarkable. GI AND BOWEL: Stomach demonstrates no acute abnormality. There is no bowel obstruction. There is sigmoid  colon diverticulosis. PERITONEUM AND RETROPERITONEUM: No ascites. No free air. VASCULATURE: Aorta is normal in caliber. LYMPH NODES: No lymphadenopathy. REPRODUCTIVE ORGANS: There is an IUD in the uterus. Exophytic right sided fibroid measures 1.5 cm. There is likely a left sided exophytic fibroid measuring 2.5 cm. BONES AND SOFT TISSUES: No acute osseous abnormality. No focal soft tissue abnormality. IMPRESSION: 1. 4 mm calculus in the proximal right ureter with mild right-sided hydronephrosis. 2. Additional punctate nonobstructing left renal calculi. Electronically signed by: Greig Pique MD 06/09/2024 07:35 PM EST RP Workstation: [id number]   US  Abdomen Limited RUQ (LIVER/GB) Result Date: 06/09/2024 CLINICAL DATA:  Right upper quadrant abdominal pain. EXAM: ULTRASOUND ABDOMEN LIMITED RIGHT UPPER QUADRANT COMPARISON:  None Available. FINDINGS: Evaluation is limited due to overlying bowel gas. Gallbladder: No gallstones or wall thickening visualized. No sonographic Murphy sign noted by sonographer. Common bile duct: Diameter: 4 mm Liver: There is slightly heterogeneous. No discrete mass.  Portal vein is patent on color Doppler imaging with normal direction of blood flow towards the liver. Other: None. IMPRESSION: No acute findings.  No gallstone. Electronically Signed   By: Vanetta Chou M.D.   On: 06/09/2024 14:17     Procedures   Medications Ordered in the ED  fentaNYL  (SUBLIMAZE ) injection 50 mcg (50 mcg Intravenous Given 06/09/24 1849)    Clinical Course as of 06/09/24 2053  Mon Jun 09, 2024  1357 US  Abdomen Limited RUQ (LIVER/GB) [KF]    Clinical Course User Index [KF] Jurline Larraine LELON Jann                                 Medical Decision Making Amount and/or Complexity of Data Reviewed Labs: ordered. Radiology: ordered.  Risk Prescription drug management.   This patient presents to the ED for concern of abdominal pain, this involves an extensive number of treatment options, and is a complaint that carries with it a high risk of complications and morbidity.  I considered the following differential and admission for this acute, potentially life threatening condition.  The differential diagnosis includes cholecystitis, cholelithiasis, kidney stone, appendicitis, colitis, musculoskeletal pain, pyelonephritis  MDM:    Patient is a 53 year old who presents with right-sided abdominal pain.  It initially was intermittent but over the last 3 days its gotten more persistent and intense.  She denies any fevers.  Ultrasound does not show any evidence of gallstones.  Labs show normal white count, normal LFTs, and pregnancy test is negative, urine does have some blood but also white cells with moderate leukocyte Estrace.  She does report that she is currently having some spotty vaginal bleeding and is perimenopausal.  CT scan was performed which shows a 4 mm stone in the proximal right ureter.  I did consult with urology given the concerns for possible urinary infection. Discussed with Dr. Norva, who also discussed with attending.  They feel that her urine is more  inflammatory rather than infectious and are good with her being discharged.  They request starting her on Flomax  and p.o. Toradol .  She was also given a prescription for a short course of hydrocodone  as well as Zofran .  Her pain seems to be well-controlled after medication here in the ED.  She was given information about calling tomorrow to make a follow-up appointment with [facility].  Return precautions were given.  (Labs, imaging, consults)  Labs: I Ordered, and personally interpreted labs.  The pertinent results include: Normal white  count, normal creatinine  Imaging Studies ordered: I ordered imaging studies including CT abdomen pelvis, gallbladder ultrasound I independently visualized and interpreted imaging. I agree with the radiologist interpretation  Additional history obtained from family member at bedside.  External records from outside source obtained and reviewed including history  Cardiac Monitoring: The patient was not maintained on a cardiac monitor.  If on the cardiac monitor, I personally viewed and interpreted the cardiac monitored which showed an underlying rhythm of:    Reevaluation: After the interventions noted above, I reevaluated the patient and found that they have :improved  Social Determinants of Health:    Disposition: Discharged to home  Co morbidities that complicate the patient evaluation  Past Medical History:  Diagnosis Date   Anxiety    Heart attack (HCC)    High cholesterol      Medicines Meds ordered this encounter  Medications   fentaNYL  (SUBLIMAZE ) injection 50 mcg   ketorolac  (TORADOL ) 10 MG tablet    Sig: Take 1 tablet (10 mg total) by mouth every 6 (six) hours as needed.    Dispense:  15 tablet    Refill:  0   tamsulosin  (FLOMAX ) 0.4 MG CAPS capsule    Sig: Take 1 capsule (0.4 mg total) by mouth daily.    Dispense:  20 capsule    Refill:  0   HYDROcodone -acetaminophen  (NORCO/VICODIN) 5-325 MG tablet    Sig: Take 1-2  tablets by mouth every 4 (four) hours as needed.    Dispense:  12 tablet    Refill:  0   ondansetron  (ZOFRAN -ODT) 4 MG disintegrating tablet    Sig: 4mg  ODT q4 hours prn nausea/vomit    Dispense:  6 tablet    Refill:  0    I have reviewed the patients home medicines and have made adjustments as needed  Problem List / ED Course: Problem List Items Addressed This Visit   None Visit Diagnoses       Kidney stone    -  Primary   Relevant Medications   fentaNYL  (SUBLIMAZE ) injection 50 mcg (Completed)   HYDROcodone -acetaminophen  (NORCO/VICODIN) 5-325 MG tablet                Final diagnoses:  Kidney stone    ED Discharge Orders          Ordered    ketorolac  (TORADOL ) 10 MG tablet  Every 6 hours PRN        06/09/24 2050    tamsulosin  (FLOMAX ) 0.4 MG CAPS capsule  Daily        06/09/24 2050    HYDROcodone -acetaminophen  (NORCO/VICODIN) 5-325 MG tablet  Every 4 hours PRN        06/09/24 2050    ondansetron  (ZOFRAN -ODT) 4 MG disintegrating tablet        06/09/24 2050               Lenor Hollering, MD 06/09/24 2057

## 2024-06-09 NOTE — ED Provider Triage Note (Signed)
 Emergency Medicine Provider Triage Evaluation Note  Kathleen Richards , a 53 y.o. female  was evaluated in triage.  Pt complains of right upper quadrant abdominal pain.  Pt seen at Urgent care and sent here for evaluation of possible gallbladder problem  Review of Systems  Positive: Ruq pain Negative: fever  Physical Exam  BP 133/72   Pulse 96   Temp 98.2 F (36.8 C) (Oral)   Resp 16   Ht 5' 3 (1.6 m)   Wt 61.7 kg   SpO2 100%   BMI 24.09 kg/m  Gen:   Awake, no distress   Resp:  Normal effort  MSK:   Moves extremities without difficulty  Other:  Tender right upper abdomen  Medical Decision Making  Medically screening exam initiated at 1:32 PM.  Appropriate orders placed.  Amariz Flamenco was informed that the remainder of the evaluation will be completed by another provider, this initial triage assessment does not replace that evaluation, and the importance of remaining in the ED until their evaluation is complete.     Flint Sonny POUR, PA-C 06/09/24 1333
